# Patient Record
Sex: Female | Born: 1960 | Race: White | Hispanic: No | Marital: Married | Smoking: Never smoker
Health system: Southern US, Community
[De-identification: ages and names within clinical notes are randomized; demographics above are authoritative.]

---

## 2007-05-22 ENCOUNTER — Encounter: Admission: RE | Admit: 2007-05-22 | Discharge: 2007-05-22 | Payer: Self-pay | Admitting: Obstetrics and Gynecology

## 2008-05-22 ENCOUNTER — Encounter: Admission: RE | Admit: 2008-05-22 | Discharge: 2008-05-22 | Payer: Self-pay | Admitting: Obstetrics and Gynecology

## 2016-10-20 ENCOUNTER — Ambulatory Visit
Admission: RE | Admit: 2016-10-20 | Discharge: 2016-10-20 | Disposition: A | Discharge status: Home / Self Care | Payer: BC Managed Care – PPO | Source: Ambulatory Visit | Attending: Internal Medicine | Admitting: Internal Medicine

## 2016-10-20 ENCOUNTER — Encounter: Payer: Self-pay | Admitting: Internal Medicine

## 2016-10-20 ENCOUNTER — Ambulatory Visit (INDEPENDENT_AMBULATORY_CARE_PROVIDER_SITE_OTHER): Payer: BC Managed Care – PPO | Admitting: Internal Medicine

## 2016-10-20 DIAGNOSIS — J301 Allergic rhinitis due to pollen: Secondary | ICD-10-CM

## 2016-10-20 DIAGNOSIS — J45990 Exercise induced bronchospasm: Secondary | ICD-10-CM | POA: Diagnosis not present

## 2016-10-20 DIAGNOSIS — E559 Vitamin D deficiency, unspecified: Secondary | ICD-10-CM | POA: Diagnosis not present

## 2016-10-20 DIAGNOSIS — Z148 Genetic carrier of other disease: Secondary | ICD-10-CM | POA: Insufficient documentation

## 2016-10-20 DIAGNOSIS — J302 Other seasonal allergic rhinitis: Secondary | ICD-10-CM | POA: Insufficient documentation

## 2016-10-20 DIAGNOSIS — E8801 Alpha-1-antitrypsin deficiency: Secondary | ICD-10-CM

## 2016-10-20 DIAGNOSIS — R509 Fever, unspecified: Secondary | ICD-10-CM

## 2016-10-20 LAB — COMPREHENSIVE METABOLIC PANEL
ALBUMIN: 4 g/dL (ref 3.6–5.1)
ALT: 18 U/L (ref 6–29)
AST: 15 U/L (ref 10–35)
Alkaline Phosphatase: 60 U/L (ref 33–130)
BILIRUBIN TOTAL: 0.4 mg/dL (ref 0.2–1.2)
BUN: 12 mg/dL (ref 7–25)
CHLORIDE: 104 mmol/L (ref 98–110)
CO2: 27 mmol/L (ref 20–31)
CREATININE: 0.66 mg/dL (ref 0.50–1.05)
Calcium: 9.1 mg/dL (ref 8.6–10.4)
Glucose, Bld: 103 mg/dL — ABNORMAL HIGH (ref 65–99)
Potassium: 4 mmol/L (ref 3.5–5.3)
SODIUM: 140 mmol/L (ref 135–146)
TOTAL PROTEIN: 6.3 g/dL (ref 6.1–8.1)

## 2016-10-20 LAB — CK: CK TOTAL: 51 U/L (ref 7–177)

## 2016-10-20 LAB — LACTATE DEHYDROGENASE: LDH: 137 U/L (ref 120–250)

## 2016-10-20 NOTE — Progress Notes (Signed)
[facility]      Reason for Consult: FUO    Referring Physician: Dr. Inda Castle    Patient ID: Erin Booth, female    DOB: [birth year], 55 y.o.   MRN: [id number]  HPI:   Here for evaluation for recurrent fever.  She brings in a fever log and notes fever up to 103 in the end of September associated with some arthralgias, chills, decreased appetite.  She did have a sick contact and resolved.  The fever though returned in November and up to 102 and resolved again but about 1 month later returned and again high up to 102.  No significant symptoms associated with it, though a 10 lb weight loss but is getting some back.  No diarrhea, no sob, no cough, no rash, no arthritis, no headache.  She does have some bladder discomfort that has been ongoing for about 2 years and actually has improved.  She is a Radio producer.  She does report some hematuria noted at some office appts but microscopic and treated for infection.    PMH: interstitial cystitis, asthma  Prior to Admission medications   Medication Sig Start Date End Date Taking? Authorizing Provider  albuterol (PROVENTIL HFA;VENTOLIN HFA) 108 (90 Base) MCG/ACT inhaler Inhale into the lungs.   Yes Historical Provider, MD  fluticasone (VERAMYST) 27.5 MCG/SPRAY nasal spray Place into the nose.   Yes Historical Provider, MD  SYMBICORT 80-4.5 MCG/ACT inhaler INL 2 PFS PO BID 09/16/16  Yes Historical Provider, MD    Allergies  Allergen Reactions  . Polymyxin B   . Povidone-Iodine Rash    Social History  Substance Use Topics  . Smoking status: Never Smoker  . Smokeless tobacco: Never Used  . Alcohol use Yes     Comment: rarely    [place]: grandmother with rheumatoid arthritis, no autoimmune disease  Review of Systems  Constitutional: negative for sweats Respiratory: negative for cough, sputum or dyspnea on exertion Cardiovascular: negative for chest pressure/discomfort Gastrointestinal: negative for nausea and  diarrhea Genitourinary: negative for dysuria and nocturia Integument/breast: negative for rash Hematologic/lymphatic: negative for lymphadenopathy and petechiae Musculoskeletal: negative for arthralgias Neurological: negative for headaches, dizziness, vertigo, tremor and weakness All other systems reviewed and are negative    Constitutional: in no apparent distress and alert  Vitals:   10/20/16 0913  BP: (!) 146/92  Pulse: 77  Temp: 98.1 F (36.7 C)   EYES: anicteric ENMT: no thrush Cardiovascular: Cor RRR and No murmurs Respiratory: CTA B; normal respiratory effort GI: Bowel sounds are normal, liver is not enlarged, spleen is not enlarged Musculoskeletal: no pedal edema noted Skin: negatives: no rash Hematologic: no cervical, no axillary, no supraclavicular lad  LABS: HIV negative, CBC, cmp wnl, CRP 48 (0-4.9), ESR 31  Assessment: FUO.  I discussed with her that often no etiology identified without localizing symptoms and people tend to do well and it resolves, though can take months and even up to 1 year.  I also discussed etiologies of fever and can include rheumatologic, autoimmune, cancer as well as infection.   Plan: 1) FUO labs including EBV, cmv, SPEP, LDH; CXR for ? Lymphadenopathy 2) UA for ? Hematuria rtc 3-4 weeks

## 2016-10-21 LAB — EPSTEIN-BARR VIRUS VCA ANTIBODY PANEL
EBV NA IgG: 311 U/mL — ABNORMAL HIGH
EBV VCA IGG: 135 U/mL — AB

## 2016-10-21 LAB — URINALYSIS, ROUTINE W REFLEX MICROSCOPIC
BILIRUBIN URINE: NEGATIVE
GLUCOSE, UA: NEGATIVE
Hgb urine dipstick: NEGATIVE
Ketones, ur: NEGATIVE
Nitrite: NEGATIVE
PH: 7 (ref 5.0–8.0)
PROTEIN: NEGATIVE
SPECIFIC GRAVITY, URINE: 1.018 (ref 1.001–1.035)

## 2016-10-21 LAB — URINALYSIS, MICROSCOPIC ONLY
Casts: NONE SEEN [LPF]
SQUAMOUS EPITHELIAL / LPF: NONE SEEN [HPF] (ref <=5)
YEAST: NONE SEEN [HPF]

## 2016-10-21 LAB — CYTOMEGALOVIRUS ANTIBODY, IGG

## 2016-10-21 LAB — RHEUMATOID FACTOR: Rhuematoid fact SerPl-aCnc: <14 IU/mL (ref <14)

## 2016-10-21 LAB — CMV IGM

## 2016-10-21 LAB — SEDIMENTATION RATE: Sed Rate: 3 mm/hr (ref 0–30)

## 2016-10-25 LAB — PROTEIN ELECTROPHORESIS, SERUM, WITH REFLEX
ALBUMIN ELP: 3.6 g/dL — AB (ref 3.8–4.8)
ALPHA-1-GLOBULIN: 0.3 g/dL (ref 0.2–0.3)
Alpha-2-Globulin: 0.9 g/dL (ref 0.5–0.9)
BETA 2: 0.3 g/dL (ref 0.2–0.5)
BETA GLOBULIN: 0.4 g/dL (ref 0.4–0.6)
GAMMA GLOBULIN: 0.7 g/dL — AB (ref 0.8–1.7)
Total Protein, Serum Electrophoresis: 6.3 g/dL (ref 6.1–8.1)

## 2016-10-27 ENCOUNTER — Telehealth: Payer: Self-pay | Admitting: *Deleted

## 2016-10-27 ENCOUNTER — Encounter: Payer: Self-pay | Admitting: Internal Medicine

## 2016-10-27 NOTE — Telephone Encounter (Signed)
I spoke with patient. She states she has chronic dysuria, but the pain is actually better right now.  No other symptoms.  I'll ask her to call if she develops any, will get UA and UCx. Erin Booth  ===View-only below this line===  ----- Message ----- From: Ginnie SmartJeffrey C Hatcher, MD Sent: 10/27/2016   2:51 PM To: Andree CossMichelle M Eyanna Mcgonagle, RN Subject: RE: Visit Follow-Up Question                   If she has sx, would give her keflex 500mg  bid for 5 days.  And get UCx. If no sx, no treatment Thanks  ----- Message ----- From: Andree CossMichelle M Myrtice Lowdermilk, RN Sent: 10/27/2016   9:58 AM To: Ginnie SmartJeffrey C Hatcher, MD Subject: FW: Visit Follow-Up Question                   Please advise on UA report from 12/21.  I tried to call her to see if she was symptomatic, but her voicemail is not yet set up.  I will send a mychart message to see. Thank you,  Marcelino DusterMichelle  ----- Message ----- From: Erin Cakeachel Papa Sent: 10/27/2016   9:30 AM To: [facility] Subject: Visit Follow-Up Question                       Dr. Luciana Axeomer,  I just wanted to ask a question about the WBC in the urinalysis.  It was well above the normal range. (I realize it was high enough to suggest that it wasn't a good sample.)  Is there any follow up needed now with this, or will we just discuss at my appointment in January?    Thank you,  Erin Booth

## 2016-11-10 ENCOUNTER — Encounter: Payer: Self-pay | Admitting: Internal Medicine

## 2016-11-10 ENCOUNTER — Ambulatory Visit (INDEPENDENT_AMBULATORY_CARE_PROVIDER_SITE_OTHER): Payer: BC Managed Care – PPO | Admitting: Internal Medicine

## 2016-11-10 DIAGNOSIS — N3010 Interstitial cystitis (chronic) without hematuria: Secondary | ICD-10-CM | POA: Diagnosis not present

## 2016-11-10 DIAGNOSIS — R509 Fever, unspecified: Secondary | ICD-10-CM | POA: Diagnosis not present

## 2016-11-10 NOTE — Assessment & Plan Note (Signed)
Chronic symptoms for years of this, predating FUO.  She is going to discuss with her PCp about possible urology input.

## 2016-11-10 NOTE — Assessment & Plan Note (Signed)
No fever still.  No new symptoms.  Will schedule for 2 months and she will call sooner if concerns arise or cancel if she is still doing well.  If typical fever will check in in 2 months and will consider CT c/a/p then.

## 2016-11-10 NOTE — Progress Notes (Signed)
   Subjective:    Patient ID: Josy Hamed, female    DOB: [birth year], 56 y.o.   MRN: [id number]  HPI Here for follow up of FUO.  Had a good fever log with high fevers since October.  I saw her in December and was not having fever at the time.  Since then has not had any new fever and no new symptoms.  Feels well overall.  Specifically no n/v/d, no SOB, no rash, no joint issues.  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     Review of Systems  Constitutional: Negative for chills, fatigue and fever.  Genitourinary: Negative for hematuria and urgency.       Some persistent interstitial cystitis  Skin: Negative for rash.  Neurological: Negative for dizziness.       Objective:   Physical Exam  Constitutional: She appears well-developed and well-nourished. No distress.  Eyes: No scleral icterus.  Musculoskeletal: She exhibits no edema.  Skin: No rash noted.  Psychiatric: She has a normal mood and affect.          Assessment & Plan:

## 2017-01-17 ENCOUNTER — Ambulatory Visit: Payer: BC Managed Care – PPO | Admitting: Internal Medicine

## 2017-01-23 ENCOUNTER — Ambulatory Visit: Payer: BC Managed Care – PPO | Admitting: Internal Medicine

## 2018-06-20 ENCOUNTER — Other Ambulatory Visit: Payer: Self-pay | Admitting: Family Medicine

## 2018-06-20 DIAGNOSIS — Z1231 Encounter for screening mammogram for malignant neoplasm of breast: Secondary | ICD-10-CM

## 2018-11-14 ENCOUNTER — Other Ambulatory Visit: Payer: Self-pay | Admitting: Family Medicine

## 2018-11-14 DIAGNOSIS — Z1231 Encounter for screening mammogram for malignant neoplasm of breast: Secondary | ICD-10-CM

## 2018-12-14 ENCOUNTER — Ambulatory Visit
Admission: RE | Admit: 2018-12-14 | Discharge: 2018-12-14 | Disposition: A | Discharge status: Home / Self Care | Payer: BC Managed Care – PPO | Source: Ambulatory Visit | Attending: Family Medicine | Admitting: Family Medicine

## 2018-12-14 DIAGNOSIS — Z1231 Encounter for screening mammogram for malignant neoplasm of breast: Secondary | ICD-10-CM

## 2020-01-04 ENCOUNTER — Ambulatory Visit: Payer: BC Managed Care – PPO

## 2020-01-17 ENCOUNTER — Other Ambulatory Visit: Payer: Self-pay | Admitting: Family Medicine

## 2020-01-17 DIAGNOSIS — Z1231 Encounter for screening mammogram for malignant neoplasm of breast: Secondary | ICD-10-CM

## 2020-02-20 ENCOUNTER — Ambulatory Visit
Admission: RE | Admit: 2020-02-20 | Discharge: 2020-02-20 | Disposition: A | Discharge status: Home / Self Care | Payer: BC Managed Care – PPO | Source: Ambulatory Visit | Attending: Family Medicine | Admitting: Family Medicine

## 2020-02-20 ENCOUNTER — Other Ambulatory Visit: Payer: Self-pay

## 2020-02-20 DIAGNOSIS — Z1231 Encounter for screening mammogram for malignant neoplasm of breast: Secondary | ICD-10-CM

## 2020-03-17 ENCOUNTER — Other Ambulatory Visit: Payer: Self-pay | Admitting: Family Medicine

## 2020-03-17 DIAGNOSIS — E2839 Other primary ovarian failure: Secondary | ICD-10-CM

## 2020-04-21 ENCOUNTER — Other Ambulatory Visit: Payer: Self-pay

## 2020-04-21 ENCOUNTER — Ambulatory Visit
Admission: RE | Admit: 2020-04-21 | Discharge: 2020-04-21 | Disposition: A | Discharge status: Home / Self Care | Payer: BC Managed Care – PPO | Source: Ambulatory Visit | Attending: Family Medicine | Admitting: Family Medicine

## 2020-04-21 DIAGNOSIS — E2839 Other primary ovarian failure: Secondary | ICD-10-CM

## 2020-11-10 ENCOUNTER — Other Ambulatory Visit: Payer: Self-pay

## 2020-11-10 ENCOUNTER — Ambulatory Visit: Payer: BC Managed Care – PPO | Attending: Internal Medicine

## 2020-11-10 DIAGNOSIS — R293 Abnormal posture: Secondary | ICD-10-CM | POA: Diagnosis present

## 2020-11-10 DIAGNOSIS — M6281 Muscle weakness (generalized): Secondary | ICD-10-CM | POA: Diagnosis present

## 2020-11-10 NOTE — Patient Instructions (Addendum)
Lifting Principles  .Maintain proper posture and head alignment. .Slide object as close as possible before lifting. .Move obstacles out of the way. .Test before lifting; ask for help if too heavy. .Tighten stomach muscles without holding breath. .Use smooth movements; do not jerk. .Use legs to do the work, and pivot with feet. .Distribute the work load symmetrically and close to the center of trunk. .Push instead of pull whenever possible.   Squat down and hold basket close to stand. Use leg muscles to do the work.    Avoid twisting or bending back. Pivot around using foot movements, and bend at knees if needed when reaching for articles.        Getting Into / Out of Bed   Lower self to lie down on one side by raising legs and lowering head at the same time. Use arms to assist moving without twisting. Bend both knees to roll onto back if desired. To sit up, start from lying on side, and use same move-ments in reverse. Keep trunk aligned with legs.    Shift weight from front foot to back foot as item is lifted off shelf.    When leaning forward to pick object up from floor, extend one leg out behind. Keep back straight. Hold onto a sturdy support with other hand.      Sit upright, head facing forward. Try using a roll to support lower back. Keep shoulders relaxed, and avoid rounded back. Keep hips level with knees. Avoid crossing legs for long periods.          DO's and DON'T's   Avoid and/or Minimize positions of forward bending ( flexion)  Side bending and rotation of the trunk  Especially when movements occur together   When your back aches:   Don't sit down   Lie down on your back with a small pillow under your head and one under your knees or as outlined by our therapist. Or, lie in the 90/90 position ( on the floor with your feet and legs on the sofa with knees and hips bent to 90 degrees)  Tying or  putting on your shoes:   Don't bend over to tie your shoes or put on socks.  Instead, bring one foot up, cross it over the opposite knee and bend forward (hinge) at the hips to so the task.  Keep your back straight.  If you cannot do this safely, then you need to use long handled assistive devices such as a shoehorn and sock puller.  Exercising:  Don't engage in ballistic types of exercise routines such as high-impact aerobics or jumping rope  Don't do exercises in the gym that bring you forward (abdominal crunches, sit-ups, touching your  toes, knee-to-chest, straight leg raising.)  Follow a regular exercise program that includes a variety of different weight-bearing activities, such as low-impact aerobics, T' ai chi or walking as your physical therapist advises  Do exercises that emphasize return to normal body alignment and strengthening of the muscles that keep your back straight, as outlined in this program or by your therapist  Household tasks:  Don't reach unnecessarily or twist your trunk when mopping, sweeping, vacuuming, raking, making beds, weeding gardens, getting objects ou of cupboards, etc.  Keep your broom, mop, vacuum, or rake close to you and mover your whole body as you move them. Walk over to the area on which you are working. Arrange kitchen, bathroom, and bedroom shelves so that frequently used items may  be reached without excessive bending, twisting, and reaching.  Use a sturdy stool if necessary.  Don't bend from the waist to pick up something up  Off the floor, out of the trunk of your car, or to brush your teeth, wash your face, etc.   Bend at the knees, keeping back straight as possible. Use a reacher if necessary.   Prevention of fracture is the so-called "BOTTOm -Line" in the management of OSTEOPOROSIS. Do not take unnecessary chances in movement. Once a compression fracture occurs, the process is very difficult to control; one fracture is frequently followed by  many more.     Osteoporosis   What is Osteoporosis?  - A silent disease in which the skeleton is weakened by decreased bone density. - Characterized by low bone mass, deterioration of bone, and increased risk of fracture postmenopausal (primary) or the result of an identifiable condition/event (secondary) - Commonly found in the wrists, spine, and hips; these are high-risk stress areas and very susceptible to fractures.  The Facts: - There are 1.5 million fractures/year o 500,000 spine; 250,000 hip with over 60,000 nursing home admissions secondary to hip fracture; and 200,000 wrist - After hip fracture, only 50% of people able to walk independently prior to the fracture return to independent ambulation. - Bone mass: Peaks at age 22-30, and begins declining at age 45-50.   Osteoporosis is defined by the World Health Organization [facility]) as:  NOF/WHO Criteria for Interpreting Results of Bone Density Assessment  Results Diagnosis  Within 1 standard deviation (SD) of young adult mean Normal  Between 1 and -2.5 SD below mean, repeat in 2 years Low bone mass (osteopenia)  Greater than -2.5 SD below mean Osteoporosis  Greater than -2.5 SD below mean and one or more fragility fractures exist Severe Osteoporosis  *Results can be affected by positioning of the body in the DEXA scan, presence of current or old fractures, arthritis, extraneous calcifications.    Osteoporosis is not just a women's disease!  - 30-40% of women will develop osteoporosis - 5-15% of males will develop osteoporosis   What are the risk factors?  1. Female 2. Thin, small frame 3. Caucasian, Asian race 4. Early menopause (<74 years old)/amenorrhea/delayed puberty 5. Old age 54. Family history (fractures, stooped posture)\ 7. Low calcium diet 8. Sedentary lifestyle 9. Alcohol, Caffeine, Smoking 10. Malnutrition, GI Disease 11. Prolonged use of Glucocorticoids (Prednisone), Meds to treat asthma, arthritis,  cancers, thyroid, and anti-seizure meds.  How do I know for sure?  Get a BONE DENSITY TEST!  This measures bone loss and it's painless, non-invasive, and only takes 5-10 minutes!  What can I do about it?  ? Decrease your risk factors (alcohol, caffeine, smoking) ? Helpful medications (see next page) ? Adequate Calcium and Vitamin D intake ? Get active! o Proper posture - Sit and stand tall! No slouching or twisting o Weight-Bearing Exercise - walking, stair climbing, elliptical; NO jogging or high-impact exercise. o Resistive Exercise - Cybex weight equipment, Nautilus, dumbbells, therabands  **Be sure to maintain proper alignment when lifting any weight!!  **When using equipment, avoid abdominal exercises which involve "crunching" or curling or twisting the trunk, biceps machines, cross-country machines, moving handlebars, or ANY MACHINE WITH ROTATION OR FORWARD BENDING!!!           Approved Pharmacologic Management of Osteoporosis  Agent Approved for prevention Approved for treatment BMD increased spine/hip Fracture reduction  Estrogen/Hormone Therapy (Estrace, Estratab, Ogen, Premarin, Vivell, Prempro, Femhert, Orthoest) Yes Yes 3-6%  35% spine and hip  Bisphosphonates  (Fosamax, Actonel, Boniva) Yes Yes 3-8% 35-50% spine and non-spine  Calcitonin (Miacalcin, Calcimar, Fortical) No Yes 0-3% None stated  Raloxifene (Evista) Yes Yes 2-3% 30-55%  Parathyroid Hormone (Forteo) No Yes, only in those at high risk for fracture None stated 53-65%     Recommended Daily Calcium Intakes   Population Group NIH/NOF* (mg elemental calcium)  Children 1-10 years [phone]  Children 11-24 years 1200-1500  Men and Women 25-64 years At least 1200  Pregnant/Lactating At least 1200  Postmenopausal women with hormone replacement therapy At least 1200  Postmenopausal women without   hormone replacement therapy At least 1200  Men and women 65 + At least 1200  *In 1987, 1990, 1994,  and 2000, the NIH held consensus conferences on osteoporosis and calcium.  This column shows the most recent recommendations regarding calcium intak for preventing and managing osteoporosis.          Calcium Content of Selected Foods  Dairy Foods Calcium Content (mg) Non-Dairy Foods Calcium Content (mg)  Buttermilk, 1 cup 300 Calcium-fortified juice, 1 cup 300  Milk, 1 cup 300 Salmon, canned with Bones, 2 oz 100  Lactaid milk, 1 cup 300-500 Oysters, raw 13-19 medium 226  Soy milk, 1 cup 200-300 Sardines, canned with bones, 3 oz 372  Yogurt (plain, lowfat) 1 cup 250-300 Shrimp, canned 3 oz 98  Frozen yogurt (fruit) 1 cup 200-600 Collard greens, cooked 1 cup 357  Cheddar, mozzarella, or [place] cheese, 1oz 205 Broccoli, cooked 1 cup 78  Cottage cheese (lowfat) 4 oz 200 Soybeans, cooked 1 cup 131  Part-skim ricotta cheese, 4oz 335 Tofu, 4oz* *  Vanilla ice cream, 1 cup 120-300    *Calcium content of tofu varies depending on processing method; check nutritional label on package for precise calcium content.     Suggested Guidelines for Calcium Supplement Use:  ? Calcium is absorbed most efficiently if taken in small amounts throughout the day.  Always divide the daily dose into smaller amounts if the total daily dose is 500mg  or more per day.  The body cannot use more than 500mg  Calcium at any one time. ? The use of manufactured supplements is encouraged.  Calcium as bone meal or dolomite may contain lead or other heavy metals as contaminants. ? Calcium supplements should not be taken with high fiber meals or with bulk forming laxatives. ? If calcium carbonate is used as the supplement form, it should be taken with meals to assure that stomach acid production is present to facilitate optimal dissolution and absorption of calcium.  This is important if atrophic gastritis with hypo- or achlorhydria is present, which it is in 20-50% of older individuals. ? It is important to  drink plenty of fluids while using the supplement to help reduce problems with side effects like constipation or bloating.  If these symptoms become a problem, switching to another form of supplement may be the answer. ? Another alternative is calcium-fortified foods, including fruit juices, cereals, and breads.  These foods are now marketed with added calcium and may be less likely to cause side effects. ? Those with personal or family histories of kidney stones should be monitored to assure that hypercalcuria does not occur.      Recommended Daily Calcium Intake  Population Calcium (mg)  Children 1-10 years [phone]  Children 11-24 years 1200-1500  Men and women 25-64 At least 1200  Pregnant/Lactating At least 1200  Postmenopausal women with hormone replacement therapy At  least 1200  Postmenopausal women without hormone replacement therapy At least 1200  Men and women 65+ At least 1200       SAFETY TIPS FOR FALL PREVENTION   1. Remove throw rugs and make certain carpet edges are securely fastened to the floor.  2. Reduce clutter, especially in traffic areas of the home.   3. Install/maintain sturdy handrails at stairs.  4. Increase wattage of lighting in hallways, bathrooms, kitchens, stairwells, and entrances to home.  5. Use night-lights near bed, in hallways, and in bathroom to improve night safety.  6. Install safety handrails in shower, tub, and around toilet.  Bathtubs and shower stalls should have non-skid surfaces.  7. When you must reach for something high, use a safety step stool, one with wide steps and a friction surface to stand on.  A type equipped with a high handrail is preferred.  8. If a cane or other walking aid has been recommended, use it to help increase your stability.  9. Wear supportive, cushioned, low-heeled shoes.  Avoid "scuffs" (backless bedroom slippers) and high heels.  10. Avoid rushing to answer a phone, doorbell, or anything else!  A  portable phone that you can take from room to room with you is a good idea for security and safety.  11. Exercise regularly and stay active!!    Resources  National Osteoporosis [employer].NOF.org   Exercise for Osteoporosis; A Safe and Effective Way to Build Bone Density and Muscle Strength By: Myra Gianotti, M.A.  Access Code: [email or web address] URL: [email or web address] Date: 11/10/2020 Prepared by: Tresa Endo  Exercises Supine Transversus Abdominis Bracing - Hands on Stomach - 1 x daily - 7 x weekly - 3 sets - 10 reps Seated Transversus Abdominis Bracing - 1 x daily - 7 x weekly - 3 sets - 10 reps

## 2020-11-10 NOTE — Therapy (Addendum)
[facility] [address], [place], [place], [address] Phone: [phone]   Fax:  [phone]  Physical Therapy Evaluation  Patient Details  Name: Erin Booth MRN: [id number] Date of Birth: 1961/09/07 Referring Provider (PT): Minette Brine, MD   Encounter Date: 11/10/2020   PT End of Session - 11/10/20 1146    Visit Number 1    Date for PT Re-Evaluation 01/05/21    Authorization Type [employer]    PT Start Time 1100    PT Stop Time 1148    PT Time Calculation (min) 48 min    Activity Tolerance Patient tolerated treatment well    Behavior During Therapy [facility] for tasks assessed/performed                   [facility] PT Assessment - 11/10/20 0001      Assessment   Medical Diagnosis age related osteoporosis without current pathological fracture    Referring Provider (PT) Minette Brine, MD    Onset Date/Surgical Date 04/30/20      Precautions   Precautions Other (comment)    Precaution Comments osteoporosis      Restrictions   Weight Bearing Restrictions No      Balance Screen   Has the patient fallen in the past 6 months No    Has the patient had a decrease in activity level because of a fear of falling?  No    Is the patient reluctant to leave their home because of a fear of falling?  No      Home Environment   Living Environment Private residence    Type of [place] Two level      Prior Function   Level of Independence Independent    Vocation Full time employment    Vocation Requirements high school latin teacher    Leisure exercise at the gym, bike riding      Cognition   Overall Cognitive Status Within Functional Limits for tasks assessed      Observation/Other Assessments   Focus on Therapeutic Outcomes (FOTO)  NA      Posture/Postural Control   Posture/Postural Control Postural limitations    Postural Limitations Forward head    Posture Comments mild forward head      ROM /  Strength   AROM / PROM / Strength AROM;Strength      AROM   Overall AROM  Within functional limits for tasks performed      Strength   Overall Strength Within functional limits for tasks performed      Ambulation/Gait   Ambulation/Gait Yes    Gait Pattern Within Functional Limits                      Objective measurements completed on examination: See above findings.               PT Education - 11/10/20 1136    Education Details osteo do and don't, body mechanics,            PT Short Term Goals - 11/10/20 1158      PT SHORT TERM GOAL #1   Title be independent in gym exercises that will avoid strain on spine    Time 4    Period Weeks    Status New    Target Date 12/08/20             PT Long Term Goals - 11/10/20  [place] #1   Title 1. Patient will verbally understand ways to manage osteoporosis with diet and correct to reduce strain postures on spine.    Time 8    Period Weeks    Status New    Target Date 01/05/21      PT LONG TERM GOAL #2   Title 2. Patient will verbally understand correct body mechanics for home and work tasks to decrease strain on spine.    Time 8    Period Weeks    Target Date 01/05/21      PT LONG TERM GOAL #3   Title Patient will verbally understand ways to strengthen postural musculature.    Time 8    Period Weeks    Status New    Target Date 01/05/21      PT LONG TERM GOAL #4   Title Initiate HEP and gym exercises  focusing on managing osteoporosis postural deficits and strength deficits.    Time 8    Period Weeks    Status New    Target Date 01/05/21      PT LONG TERM GOAL #5   Title Patient can verbally understand the dos and don'ts of osteoporosis management.    Time 8    Period Weeks    Status New    Target Date 01/05/21                  Plan - 11/10/20 1210    Clinical Impression Statement Pt is a 60 year old active female who presents to PT with new onset of  osteoporosis with t-score in the "severe" range.  Pt would like to receive education regarding osteoporosis and education regarding exercises that are safe to complete.  Pt with mild forward head posture but overall good alignment.  Strength and mobility are normal and unremarkable.  Pt is active at the gym and works as at [employer].  Initial session included answering pt questions and providing education regarding dos and don'ts of osteoporosis, general osteoporosis information and body mechanics education.  Pt will return in 2 weeks for further advancement of exercises.    Personal Factors and Comorbidities Comorbidity 1    Comorbidities osteoporosis    Examination-Activity Limitations Other    Stability/Clinical Decision Making Stable/Uncomplicated    Clinical Decision Making Low    Rehab Potential Excellent    PT Frequency Biweekly    PT Duration 8 weeks    PT Treatment/Interventions ADLs/Self Care Home Management;Therapeutic exercise;Patient/family education;Manual techniques;Therapeutic activities;Neuromuscular re-education;Functional mobility training    PT Next Visit Plan answer any pt questions from initial session, add to HEP    PT Home Exercise Plan Access Code: [email or web address]    Consulted and Agree with Plan of Care Patient           Patient will benefit from skilled therapeutic intervention in order to improve the following deficits and impairments:  Decreased knowledge of precautions,Improper body mechanics,Postural dysfunction  Visit Diagnosis: Abnormal posture - Plan: PT plan of care cert/re-cert  Muscle weakness (generalized) - Plan: PT plan of care cert/re-cert     Problem List Patient Active Problem List   Diagnosis Date Noted  . Interstitial cystitis (chronic) without hematuria 11/10/2016  . Exercise-induced asthma 10/20/2016  . Alpha-1-antitrypsin deficiency carrier 10/20/2016  . Seasonal allergies 10/20/2016  . Vitamin D deficiency 10/20/2016  . FUO  (fever of unknown origin) 10/20/2016     Erin Booth,  PT 11/10/20 12:20 PM PHYSICAL THERAPY DISCHARGE SUMMARY  Visits from Start of Care: 1  Current functional level related to goals / functional outcomes: Pt attended 1 PT session for evaluation and didn't return for further sessions.  See above for most current PT status   Remaining deficits: See above   Education / Equipment: HEP Plan: Patient agrees to discharge.  Patient goals were not met. Patient is being discharged due to not returning since the last visit.  ?????        Erin Booth, PT 01/14/21 8:30 AM  [facility] [address], [place], [place], [address] Phone: [phone]   Fax:  [phone]  Name: Adriona Kaney MRN: [id number] Date of Birth: 12-Jan-1961

## 2020-12-08 ENCOUNTER — Ambulatory Visit: Payer: BC Managed Care – PPO

## 2021-03-30 ENCOUNTER — Other Ambulatory Visit: Payer: Self-pay | Admitting: Family Medicine

## 2021-03-30 DIAGNOSIS — Z1231 Encounter for screening mammogram for malignant neoplasm of breast: Secondary | ICD-10-CM

## 2021-03-31 ENCOUNTER — Other Ambulatory Visit: Payer: Self-pay

## 2021-03-31 ENCOUNTER — Ambulatory Visit
Admission: RE | Admit: 2021-03-31 | Discharge: 2021-03-31 | Disposition: A | Discharge status: Home / Self Care | Payer: BC Managed Care – PPO | Source: Ambulatory Visit | Attending: Family Medicine | Admitting: Family Medicine

## 2021-03-31 DIAGNOSIS — Z1231 Encounter for screening mammogram for malignant neoplasm of breast: Secondary | ICD-10-CM

## 2022-02-09 IMAGING — MG MM DIGITAL SCREENING BILAT W/ TOMO AND CAD
8 series · 9 of 24 positions shown · non-contrast
Comparison: Previous exam(s).

CLINICAL DATA: Screening.

EXAM:
DIGITAL SCREENING BILATERAL MAMMOGRAM WITH TOMOSYNTHESIS AND CAD
TECHNIQUE: Bilateral screening digital craniocaudal and mediolateral oblique
mammograms were obtained. Bilateral screening digital breast
tomosynthesis was performed. The images were evaluated with
computer-aided detection.

[R MLO synth-2D]
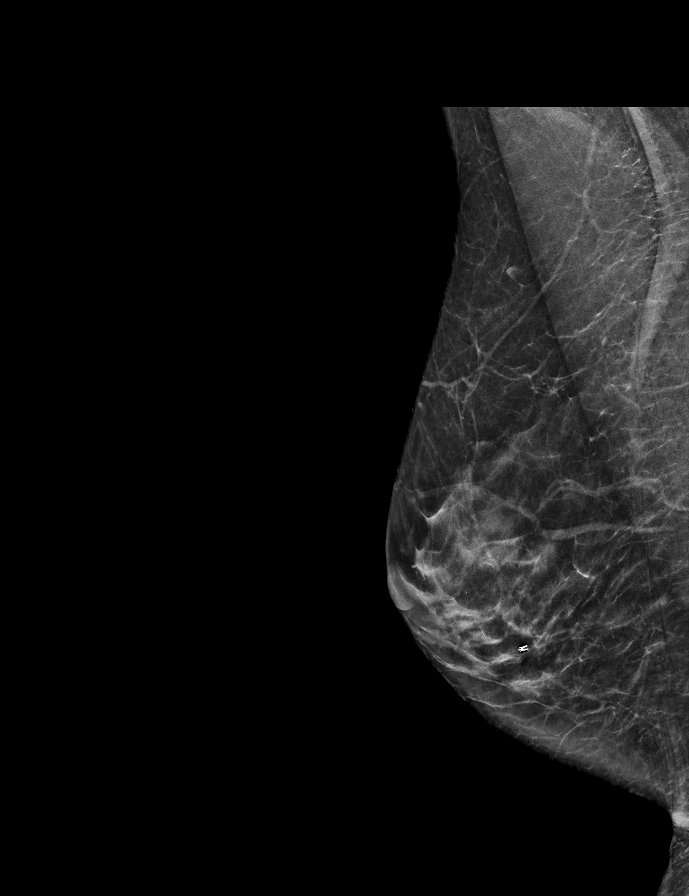

[L MLO synth-2D]
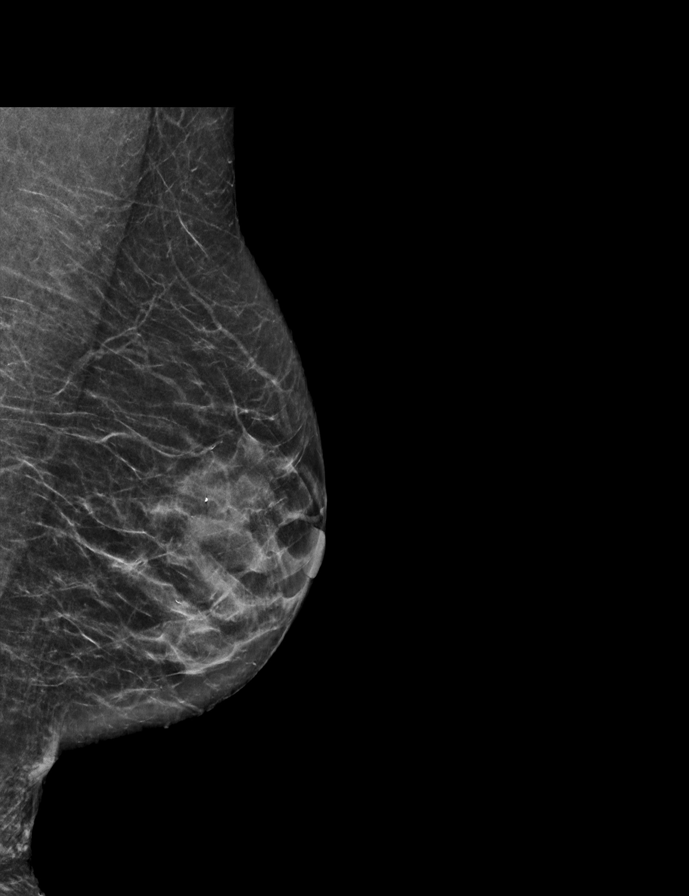

[R CC synth-2D]
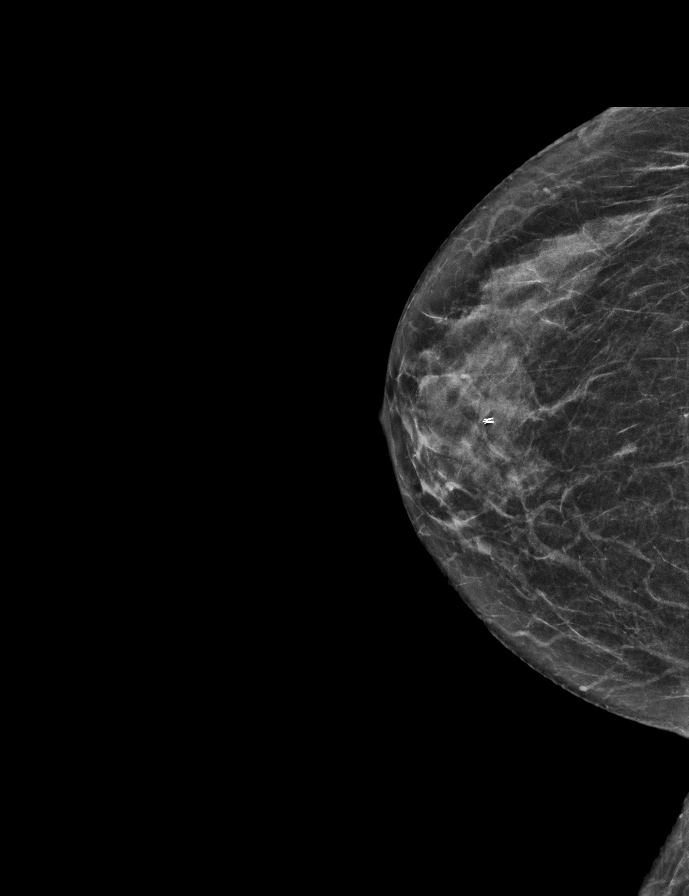

[L CC synth-2D]
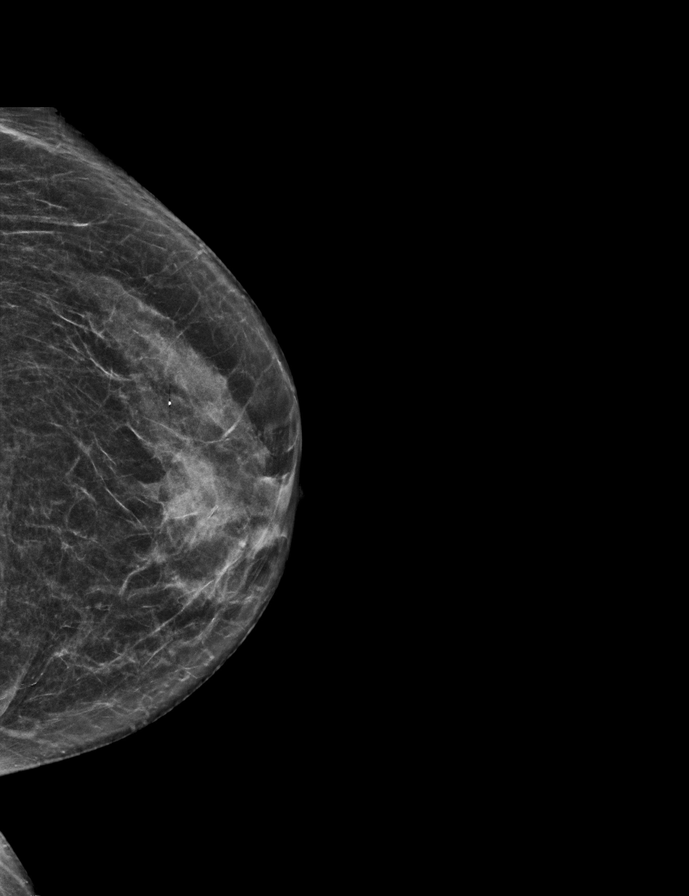

[R MLO tomo · 2 of 52 frames shown]
[frame 17/52]
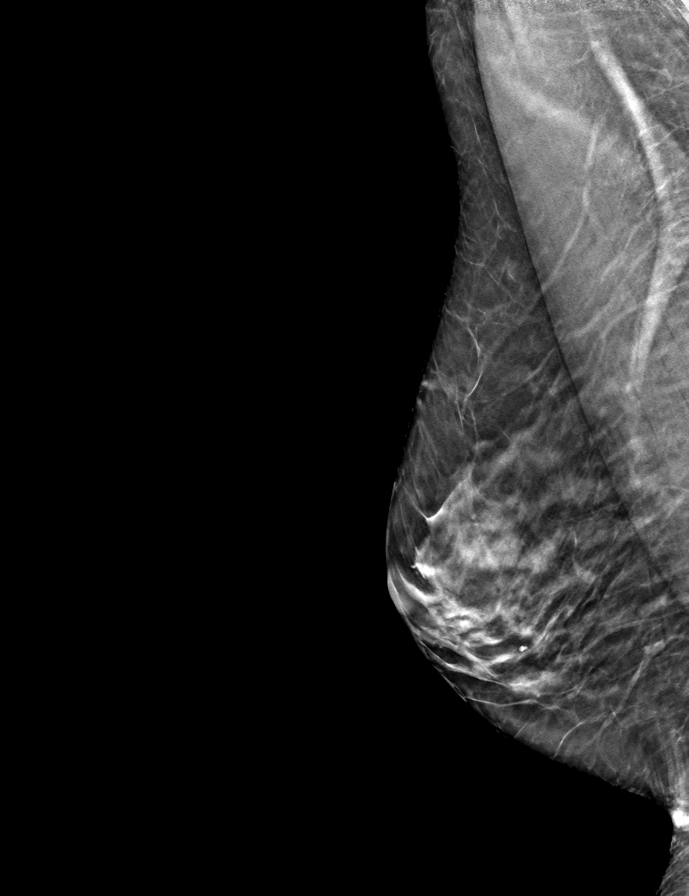
[frame 27/52]
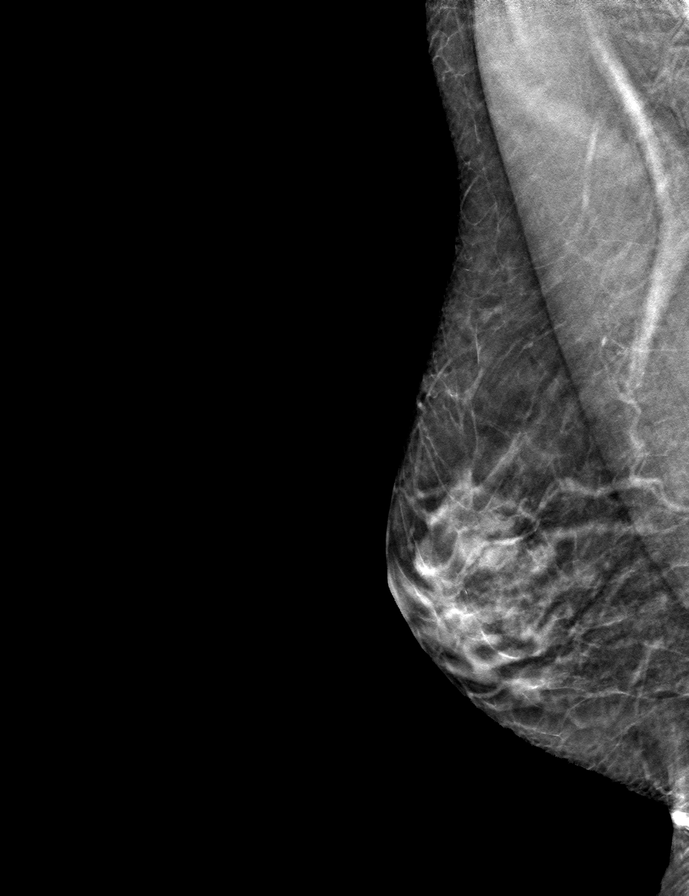

[L CC tomo · tomo slice 25/48.0]
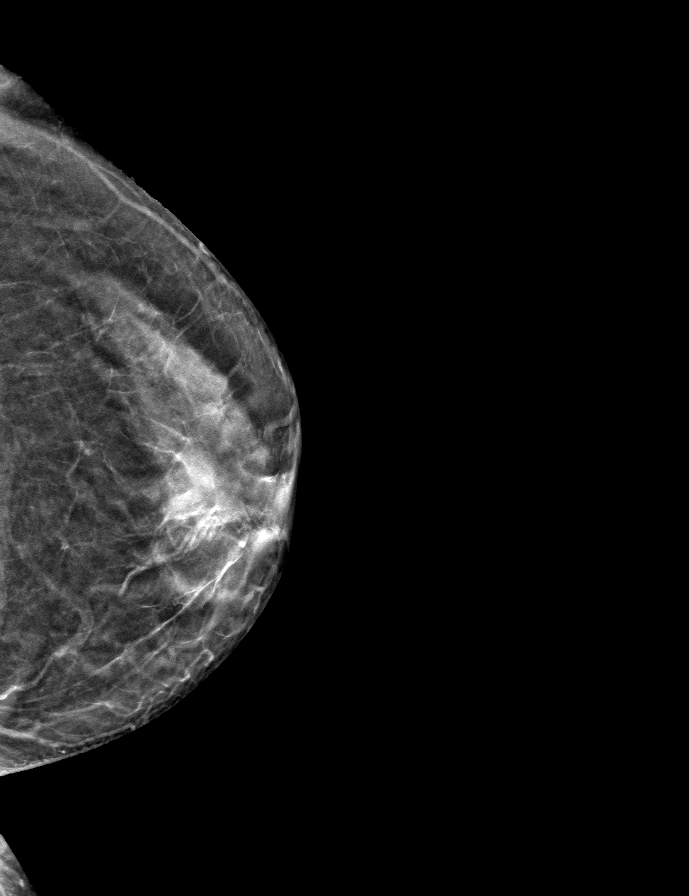

[L MLO tomo · tomo slice 27/54.0]
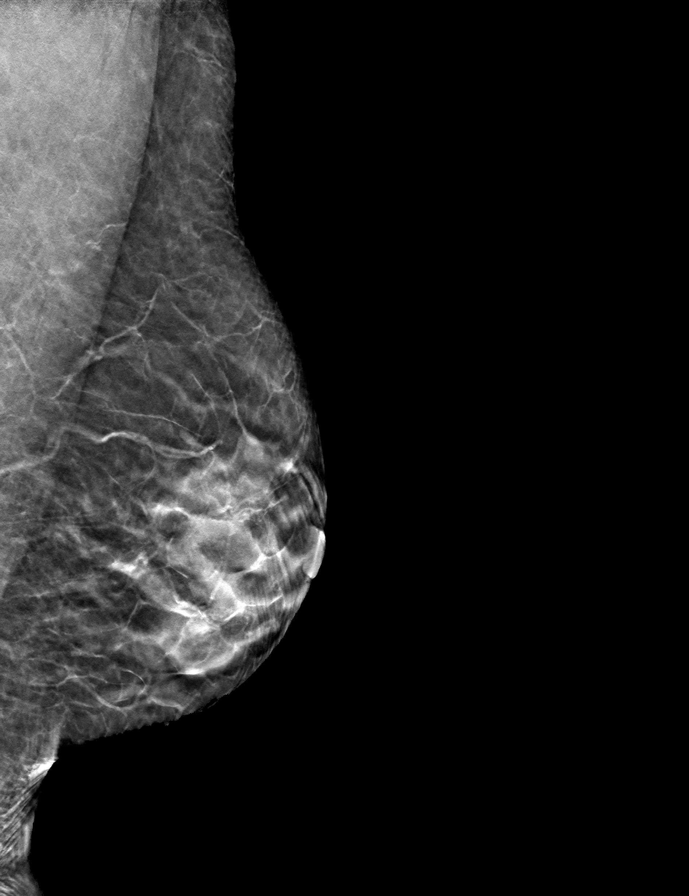

[R CC tomo · tomo slice 25/48.0]
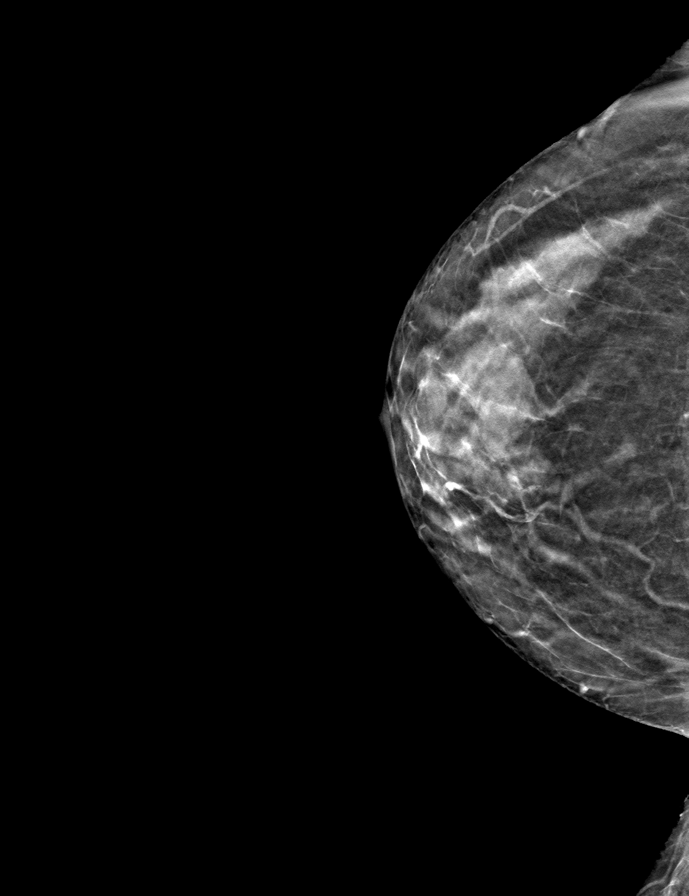

[9 of 24 positions shown; findings below may reference images not displayed]

ACR Breast Density Category c: The breast tissue is heterogeneously
dense, which may obscure small masses.
FINDINGS: There are no findings suspicious for malignancy. The images were
evaluated with computer-aided detection.
IMPRESSION: No mammographic evidence of malignancy. A result letter of this
screening mammogram will be mailed directly to the patient.

RECOMMENDATION:
Screening mammogram in one year. (Code:T4-5-GWO)

BI-RADS CATEGORY  1: Negative.

## 2022-04-11 ENCOUNTER — Other Ambulatory Visit: Payer: Self-pay | Admitting: Family Medicine

## 2022-04-11 DIAGNOSIS — M810 Age-related osteoporosis without current pathological fracture: Secondary | ICD-10-CM

## 2022-05-13 ENCOUNTER — Other Ambulatory Visit: Payer: Self-pay | Admitting: Family Medicine

## 2022-05-13 DIAGNOSIS — Z1231 Encounter for screening mammogram for malignant neoplasm of breast: Secondary | ICD-10-CM

## 2022-05-19 ENCOUNTER — Ambulatory Visit
Admission: RE | Admit: 2022-05-19 | Discharge: 2022-05-19 | Disposition: A | Discharge status: Home / Self Care | Payer: BC Managed Care – PPO | Source: Ambulatory Visit | Attending: Family Medicine | Admitting: Family Medicine

## 2022-05-19 DIAGNOSIS — Z1231 Encounter for screening mammogram for malignant neoplasm of breast: Secondary | ICD-10-CM

## 2022-05-24 ENCOUNTER — Other Ambulatory Visit: Payer: Self-pay | Admitting: Family Medicine

## 2022-05-24 DIAGNOSIS — R928 Other abnormal and inconclusive findings on diagnostic imaging of breast: Secondary | ICD-10-CM

## 2022-06-02 ENCOUNTER — Ambulatory Visit
Admission: RE | Admit: 2022-06-02 | Discharge: 2022-06-02 | Disposition: A | Discharge status: Home / Self Care | Payer: BC Managed Care – PPO | Source: Ambulatory Visit | Attending: Family Medicine | Admitting: Family Medicine

## 2022-06-02 DIAGNOSIS — R928 Other abnormal and inconclusive findings on diagnostic imaging of breast: Secondary | ICD-10-CM

## 2022-10-03 ENCOUNTER — Other Ambulatory Visit: Payer: BC Managed Care – PPO

## 2022-10-04 ENCOUNTER — Ambulatory Visit
Admission: RE | Admit: 2022-10-04 | Discharge: 2022-10-04 | Disposition: A | Discharge status: Home / Self Care | Payer: BC Managed Care – PPO | Source: Ambulatory Visit | Attending: Family Medicine | Admitting: Family Medicine

## 2022-10-04 DIAGNOSIS — M810 Age-related osteoporosis without current pathological fracture: Secondary | ICD-10-CM

## 2023-04-19 ENCOUNTER — Other Ambulatory Visit: Payer: Self-pay | Admitting: Family Medicine

## 2023-04-19 DIAGNOSIS — Z1231 Encounter for screening mammogram for malignant neoplasm of breast: Secondary | ICD-10-CM

## 2023-05-23 ENCOUNTER — Ambulatory Visit
Admission: RE | Admit: 2023-05-23 | Discharge: 2023-05-23 | Disposition: A | Discharge status: Home / Self Care | Payer: BC Managed Care – PPO | Source: Ambulatory Visit | Attending: Family Medicine | Admitting: Family Medicine

## 2023-05-23 DIAGNOSIS — Z1231 Encounter for screening mammogram for malignant neoplasm of breast: Secondary | ICD-10-CM

## 2023-05-24 ENCOUNTER — Other Ambulatory Visit: Payer: Self-pay | Admitting: Family Medicine

## 2023-05-24 DIAGNOSIS — R928 Other abnormal and inconclusive findings on diagnostic imaging of breast: Secondary | ICD-10-CM

## 2023-05-25 ENCOUNTER — Other Ambulatory Visit (HOSPITAL_BASED_OUTPATIENT_CLINIC_OR_DEPARTMENT_OTHER): Payer: Self-pay | Admitting: Family Medicine

## 2023-05-25 DIAGNOSIS — Z8249 Family history of ischemic heart disease and other diseases of the circulatory system: Secondary | ICD-10-CM

## 2023-06-01 ENCOUNTER — Ambulatory Visit
Admission: RE | Admit: 2023-06-01 | Discharge: 2023-06-01 | Disposition: A | Discharge status: Home / Self Care | Payer: BC Managed Care – PPO | Source: Ambulatory Visit | Attending: Family Medicine | Admitting: Family Medicine

## 2023-06-01 ENCOUNTER — Other Ambulatory Visit: Payer: Self-pay | Admitting: Family Medicine

## 2023-06-01 DIAGNOSIS — N6310 Unspecified lump in the right breast, unspecified quadrant: Secondary | ICD-10-CM

## 2023-06-01 DIAGNOSIS — R928 Other abnormal and inconclusive findings on diagnostic imaging of breast: Secondary | ICD-10-CM

## 2023-12-06 ENCOUNTER — Ambulatory Visit: Payer: BC Managed Care – PPO | Admitting: Internal Medicine

## 2023-12-18 ENCOUNTER — Other Ambulatory Visit (HOSPITAL_BASED_OUTPATIENT_CLINIC_OR_DEPARTMENT_OTHER): Payer: BC Managed Care – PPO

## 2024-04-05 ENCOUNTER — Ambulatory Visit (HOSPITAL_BASED_OUTPATIENT_CLINIC_OR_DEPARTMENT_OTHER)
Admission: RE | Admit: 2024-04-05 | Discharge: 2024-04-05 | Disposition: A | Discharge status: Home / Self Care | Payer: Self-pay | Source: Ambulatory Visit | Attending: Family Medicine | Admitting: Family Medicine

## 2024-04-05 DIAGNOSIS — Z8249 Family history of ischemic heart disease and other diseases of the circulatory system: Secondary | ICD-10-CM | POA: Insufficient documentation

## 2024-05-01 ENCOUNTER — Telehealth: Payer: Self-pay | Admitting: Cardiology

## 2024-05-01 NOTE — Telephone Encounter (Signed)
 Office is requesting a callback regarding Dr. Bernie reading pt's CT results and stated on the report .325 but the office is concerned since that's very low but Dr. Bernie also said it's at 50%. They'd like clarity to see if he meant to put 32.5 instead. Please advise.

## 2024-06-03 ENCOUNTER — Other Ambulatory Visit: Payer: Self-pay | Admitting: Family Medicine

## 2024-06-03 DIAGNOSIS — N6310 Unspecified lump in the right breast, unspecified quadrant: Secondary | ICD-10-CM

## 2024-06-03 DIAGNOSIS — Z1231 Encounter for screening mammogram for malignant neoplasm of breast: Secondary | ICD-10-CM

## 2024-06-20 ENCOUNTER — Ambulatory Visit
Admission: RE | Admit: 2024-06-20 | Discharge: 2024-06-20 | Disposition: A | Discharge status: Home / Self Care | Source: Ambulatory Visit | Attending: Family Medicine | Admitting: Family Medicine

## 2024-06-20 ENCOUNTER — Encounter

## 2024-06-20 DIAGNOSIS — N6310 Unspecified lump in the right breast, unspecified quadrant: Secondary | ICD-10-CM

## 2024-06-20 DIAGNOSIS — Z1231 Encounter for screening mammogram for malignant neoplasm of breast: Secondary | ICD-10-CM
